# Patient Record
Sex: Male | Born: 2000 | Race: White | Hispanic: No | Marital: Single | Smoking: Never smoker
Health system: Southern US, Community
[De-identification: ages and names within clinical notes are randomized; demographics above are authoritative.]

## PROBLEM LIST (undated history)

## (undated) DIAGNOSIS — S060X9A Concussion with loss of consciousness of unspecified duration, initial encounter: Secondary | ICD-10-CM

## (undated) DIAGNOSIS — F909 Attention-deficit hyperactivity disorder, unspecified type: Secondary | ICD-10-CM

## (undated) DIAGNOSIS — R278 Other lack of coordination: Secondary | ICD-10-CM

## (undated) DIAGNOSIS — F840 Autistic disorder: Secondary | ICD-10-CM

## (undated) DIAGNOSIS — Z8669 Personal history of other diseases of the nervous system and sense organs: Secondary | ICD-10-CM

## (undated) DIAGNOSIS — S060XAA Concussion with loss of consciousness status unknown, initial encounter: Secondary | ICD-10-CM

## (undated) HISTORY — DX: Personal history of other diseases of the nervous system and sense organs: Z86.69

## (undated) HISTORY — DX: Concussion with loss of consciousness of unspecified duration, initial encounter: S06.0X9A

## (undated) HISTORY — PX: CIRCUMCISION: SUR203

## (undated) HISTORY — DX: Autistic disorder: F84.0

## (undated) HISTORY — PX: ADENOIDECTOMY: SUR15

## (undated) HISTORY — DX: Other lack of coordination: R27.8

## (undated) HISTORY — PX: DENTAL SURGERY: SHX609

## (undated) HISTORY — PX: TONSILLECTOMY: SUR1361

## (undated) HISTORY — DX: Concussion with loss of consciousness status unknown, initial encounter: S06.0XAA

---
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
## 2022-08-14 ENCOUNTER — Ambulatory Visit (INDEPENDENT_AMBULATORY_CARE_PROVIDER_SITE_OTHER): Payer: BC Managed Care – PPO | Admitting: Physician Assistant

## 2022-08-14 ENCOUNTER — Encounter: Payer: Self-pay | Admitting: Physician Assistant

## 2022-08-14 VITALS — BP 129/85 | HR 99 | Temp 98.2°F | Wt 330.4 lb

## 2022-08-14 DIAGNOSIS — J020 Streptococcal pharyngitis: Secondary | ICD-10-CM | POA: Diagnosis not present

## 2022-08-14 DIAGNOSIS — R051 Acute cough: Secondary | ICD-10-CM

## 2022-08-14 LAB — VERITOR FLU A/B WAIVED
Influenza A: NEGATIVE
Influenza B: NEGATIVE

## 2022-08-14 LAB — RAPID STREP SCREEN (MED CTR MEBANE ONLY): Strep Gp A Ag, IA W/Reflex: POSITIVE — AB

## 2022-08-14 MED ORDER — AMOXICILLIN 500 MG PO CAPS: 500.0000 mg | ORAL_CAPSULE | Freq: Two times a day (BID) | ORAL | 0 refills | Status: AC

## 2022-08-14 NOTE — Progress Notes (Signed)
Acute Office Visit   Patient: Steve Quinn   DOB: 2001-06-22   21 y.o. Male  MRN: [id number] Visit Date: 08/14/2022  Today's healthcare provider: Oswaldo Conroy Diem Dicocco, PA-C  Introduced myself to the patient as a Secondary school teacher and provided education on APPs in clinical practice.    Chief Complaint  Patient presents with   URI    Pt states he woke up yesterday with a sore throat, cough, fever, and congestion. States he was exposed to the flu on Sunday evening.    Subjective    HPI HPI     URI    Additional comments: Pt states he woke up yesterday with a sore throat, cough, fever, and congestion. States he was exposed to the flu on Sunday evening.       Last edited by Pablo Ledger, CMA on 08/14/2022  8:52 AM.       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   [DISCONTINUED] cloNIDine (CATAPRES) 0.1 MG tablet TAKE 1/2 TABLET BY MOUTH EVERY MORNING AND 1 TABLET EVERY EVENING (Patient not taking: Reported on 10/13/2019)   [DISCONTINUED] FOCALIN XR 20 MG 24 hr capsule Take 1 capsule in the morning (Patient not taking: Reported on 10/13/2019)   No facility-administered medications prior to visit.    Review of Systems  Constitutional:  Positive for fatigue and fever.  HENT:  Positive for congestion, postnasal drip, sinus pressure, sinus pain, sore throat and trouble swallowing.   Respiratory:  Negative for cough,  shortness of breath and wheezing.   Gastrointestinal:  Negative for diarrhea, nausea and vomiting.  Musculoskeletal:  Negative for myalgias.  Neurological:  Positive for headaches. Negative for dizziness.       Objective    BP 129/85   Pulse 99   Temp 98.2 F (36.8 C) (Oral)   Wt (!) 330 lb 6.4 oz (149.9 kg)   SpO2 98%    Physical Exam Vitals reviewed.  Constitutional:      General: He is awake.     Appearance: Normal appearance. He is well-developed and well-groomed.  HENT:     Head: Normocephalic and atraumatic.     Right Ear: There is impacted cerumen.     Left Ear: There is impacted cerumen.     Mouth/Throat:     Lips: Pink.     Mouth: Mucous membranes are moist.     Pharynx: Oropharynx is clear. Uvula midline. Posterior oropharyngeal erythema present. No oropharyngeal exudate.  Cardiovascular:     Rate and Rhythm: Normal rate and regular rhythm.     Heart sounds: Normal heart sounds. No murmur heard.    No friction rub. No gallop.  Pulmonary:     Effort: Pulmonary effort is normal.     Breath sounds: Normal breath sounds. No decreased air movement. No decreased breath sounds, wheezing, rhonchi or rales.  Lymphadenopathy:     Head:  Right side of head: No submental, submandibular or preauricular adenopathy.     Left side of head: No submental, submandibular or preauricular adenopathy.     Cervical:     Right cervical: No superficial or posterior cervical adenopathy.    Left cervical: No superficial or posterior cervical adenopathy.     Upper Body:     Right upper body: No supraclavicular adenopathy.     Left upper body: No supraclavicular adenopathy.  Neurological:     Mental Status: He is alert.  Psychiatric:        Behavior: Behavior is cooperative.       Results for orders placed or performed in visit on 08/14/22  Rapid Strep screen([facility])   Specimen: Other   Other  Result Value Ref Range   Strep Gp A Ag, IA W/Reflex Positive (A)  Negative  Veritor Flu A/B Waived  Result Value Ref Range   Influenza A Negative Negative   Influenza B Negative Negative    Assessment & Plan      Return in about 3 months (around 11/13/2022), or Annual physical exam.      Problem List Items Addressed This Visit   None Visit Diagnoses     Strep throat    -  Primary Acute, new concern Reports symptoms started yesterday with sore throat, cough, and sinus congestion Rapid strep was positive-reviewed results in apt with pt - will send in Amoxicillin 500 mg PO BID x 10 days  Reviewed care instructions with him and recommendations to prevent reinfection Follow up as needed for persistent or progressing symptoms    Relevant Medications   amoxicillin (AMOXIL) 500 MG capsule   Other Relevant Orders   Rapid Strep screen([facility]) (Completed)   Novel Coronavirus, NAA ([facility])   Acute cough     Acute, new concern Rapid strep was positive- concerned that he may have two acute illnesses given symptoms  Reviewed Abx for strep and recommend symptomatic management for congestion, cough  Instructions provided via AVS and during apt  Follow up as needed for persistent or progressing symptoms    Relevant Orders   Veritor Flu A/B Waived (Completed)   Novel Coronavirus, NAA ([facility])        Return in about 3 months (around 11/13/2022), or Annual physical exam.   I, Azzie Thiem E Keerthi Hazell, PA-C, have reviewed all documentation for this visit. The documentation on 08/14/22 for the exam, diagnosis, procedures, and orders are all accurate and complete.   Jacquelin Hawking, MHS, PA-C [facility]

## 2022-08-14 NOTE — Patient Instructions (Addendum)
Your rapid Group A Strep test came back positive / your Strep culture came back positive This indicates an active Strep Pharyngitis or strep throat infection which will need an antibiotic to resolve to prevent further complications I have sent in a prescription for Amoxicillin 500 mg to be taken by mouth twice per day for 10 days / FINISH THE ENTIRE COURSE unless you are instructed to stop or develop an allergic reaction  Stay well hydrated, I usually recommend consuming about 75 oz or more of water and hydrating beverages per day while recovering from such an infection.   You can use over the counter Ibuprofen and Tylenol (alternating every 4 hours) as needed to assist with fever and pain/discomfort  I recommend discarding and replacing anything that you have used in your mouth in the last 72 hours prior to your symptoms - this includes your toothbrush, straws, mouth guards, etc. Unless you have a way of sanitizing them to reduce risk of reinfection   Do not share drinks or food with anyone until your antibiotic is complete  If you have further concerns or your symptoms seem like they are getting worse, please let us know   I also believe you may have a cold at this time since you also have congestion and upper respiratory symptoms that are usually not caused by Strep throat.  Symptoms can last for 3-10 days with lingering cough and intermittent symptoms lasting weeks after that.  The goal of treatment at this time is to reduce your symptoms and discomfort    You can use over the counter medications such as Dayquil/Nyquil, AlkaSeltzer formulations, etc to provide further relief of symptoms according to the manufacturer's instructions  If preferred you can use Coricidin to manage your symptoms rather than those medications mentioned above.    If your symptoms do not improve or become worse in the next 5-7 days please make an apt at the office so we can see you  Go to the ER if you begin to  have more serious symptoms such as shortness of breath, trouble breathing, loss of consciousness, swelling around the eyes, high fever, severe lasting headaches, vision changes or neck pain/stiffness.

## 2022-08-16 LAB — NOVEL CORONAVIRUS, NAA: SARS-CoV-2, NAA: NOT DETECTED

## 2022-11-13 ENCOUNTER — Encounter: Payer: Self-pay | Admitting: Family Medicine

## 2022-11-18 ENCOUNTER — Emergency Department (HOSPITAL_BASED_OUTPATIENT_CLINIC_OR_DEPARTMENT_OTHER): Payer: No Typology Code available for payment source | Admitting: Radiology

## 2022-11-18 ENCOUNTER — Other Ambulatory Visit: Payer: Self-pay

## 2022-11-18 ENCOUNTER — Emergency Department (HOSPITAL_BASED_OUTPATIENT_CLINIC_OR_DEPARTMENT_OTHER)
Admission: EM | Admit: 2022-11-18 | Discharge: 2022-11-18 | Disposition: A | Discharge status: Home / Self Care | Payer: No Typology Code available for payment source | Attending: Emergency Medicine | Admitting: Emergency Medicine

## 2022-11-18 ENCOUNTER — Encounter (HOSPITAL_BASED_OUTPATIENT_CLINIC_OR_DEPARTMENT_OTHER): Payer: Self-pay

## 2022-11-18 DIAGNOSIS — Y990 Civilian activity done for income or pay: Secondary | ICD-10-CM | POA: Diagnosis not present

## 2022-11-18 DIAGNOSIS — S99911A Unspecified injury of right ankle, initial encounter: Secondary | ICD-10-CM | POA: Diagnosis not present

## 2022-11-18 DIAGNOSIS — X509XXA Other and unspecified overexertion or strenuous movements or postures, initial encounter: Secondary | ICD-10-CM | POA: Insufficient documentation

## 2022-11-18 DIAGNOSIS — M25571 Pain in right ankle and joints of right foot: Secondary | ICD-10-CM | POA: Diagnosis present

## 2022-11-18 MED ORDER — NAPROXEN 375 MG PO TABS: 375.0000 mg | ORAL_TABLET | Freq: Two times a day (BID) | ORAL | 0 refills | Status: AC

## 2022-11-18 NOTE — ED Provider Notes (Signed)
[facility] Provider Note   CSN: [id number] Arrival date & time: 11/18/22  1142     History  Chief Complaint  Patient presents with   Ankle Pain    Steve Quinn is a 22 y.o. male who presents emergency department with a chief complaint of right ankle pain.  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  He stepped onto uneven surface, rolled his right ankle and had immediate searing pain.  He states that after a few minutes he was able to move the ankle but had a lot of pain trying to bear weight.  He has no previous injuries to this area.  Denies numbness or tingling.    Ankle Pain      Home Medications Prior to Admission medications   Not on File      Allergies    Patient has no known allergies.    Review of Systems   Review of Systems  Physical Exam Updated Vital Signs BP 129/84   Pulse 79   Temp 98.3 F (36.8 C) (Oral)   Resp 20   Ht 6\' 3"  (1.905 m)   Wt 136.1 kg   SpO2 100%   BMI 37.50 kg/m  Physical Exam Musculoskeletal:     Comments: R ankle exam: There is swelling and tenderness over the lateral malleolus. No tenderness over the medial aspect of the ankle. The fifth metatarsal is not tender. The ankle joint is intact without excessive opening on stressing. The rest of the foot, ankle and leg exam is normal.     ED Results / Procedures / Treatments   Labs (all labs ordered are listed, but only abnormal results are displayed) Labs Reviewed - No data to display  EKG None  Radiology DG Ankle Complete Right  Result Date: 11/18/2022 CLINICAL DATA:  Trauma, pain EXAM: RIGHT ANKLE - COMPLETE 3+ VIEW COMPARISON:  None Available. FINDINGS: There is minimal cortical irregularity in the lateral margin of talus inferior to the lateral malleolus. Rest of the bony structures are unremarkable. There is marked soft tissue swelling over the lateral malleolus. Small plantar spur is seen in calcaneus.  IMPRESSION: There is minimal cortical irregularity in the lateral margin of talus which may suggest recent or old avulsion. There is marked soft tissue swelling over the lateral malleolus. Small plantar spur is seen in calcaneus. Electronically Signed   By: Elmer Picker M.D.   On: 11/18/2022 12:54    Procedures Procedures    Medications Ordered in ED Medications - No data to display  ED Course/ Medical Decision Making/ A&P                             Medical Decision Making Amount and/or Complexity of Data Reviewed Radiology: ordered and independent interpretation performed.    Details: I personally visualized and interpreted the images using our PACS system. Acute findings include:  None    Patient X-Ray negative for obvious fracture or dislocation. Pain managed in ED.   Home Care: Rest and elevate the injured ankle, apply ice intermittently. Use crutches without weight bearing until able to comfortable bear partial weight, then progress to full weight bearing as tolerated. Splint applied. See ortho prn.  Patient will be dc home & is agreeable with above plan.         Final Clinical Impression(s) / ED Diagnoses Final diagnoses:  None  Rx / DC Orders ED Discharge Orders     None         Margarita Mail, PA-C 11/18/22 1327    Regan Lemming, MD 11/18/22 [phone]

## 2022-11-18 NOTE — Discharge Instructions (Signed)
Contact a health care provider if: You have bruising or swelling that get worse all of a sudden. Your pain does not get better with medicine. Get help right away if: Your foot or toes become numb or blue. You have severe pain that gets worse.

## 2022-11-18 NOTE — ED Triage Notes (Signed)
Patient arrives to ED POV C/O RT Ankle pain. Per pt he was getting out of his truck and stepped into a hole in the ground and twisted his ankle. Pt states painful to bear weight. Pt A/O x4. No other complaints at this time.

## 2022-11-19 ENCOUNTER — Telehealth: Payer: Self-pay

## 2022-11-19 NOTE — Transitions of Care (Post Inpatient/ED Visit) (Signed)
   11/19/2022  Name: Steve Quinn MRN: [id number] DOB: Aug 07, 2001  Today's TOC FU Call Status: Today's TOC FU Call Status:: Successful TOC FU Call Competed TOC FU Call Complete Date: 11/19/22  Transition Care Management Follow-up Telephone Call Date of Discharge: 11/18/22 Discharge Facility: [facility]) Type of Discharge: Emergency Department Reason for ED Visit: Orthopedic Conditions Orthopedic/Injury Diagnosis: Fracture How have you been since you were released from the hospital?: Better Any questions or concerns?: No  Items Reviewed: Did you receive and understand the discharge instructions provided?: Yes Medications obtained and verified?: Yes (Medications Reviewed) Any new allergies since your discharge?: No Dietary orders reviewed?: NA Do you have support at home?: Yes  Home Care and Equipment/Supplies: [place] Ordered?: NA Any new equipment or medical supplies ordered?: NA  Functional Questionnaire: Do you need assistance with bathing/showering or dressing?: No Do you need assistance with meal preparation?: No Do you need assistance with eating?: No Do you have difficulty maintaining continence: No Do you need assistance with getting out of bed/getting out of a chair/moving?: No Do you have difficulty managing or taking your medications?: No  Follow up appointments reviewed: PCP Follow-up appointment confirmed?: NA Specialist Hospital Follow-up appointment confirmed?: NA Do you need transportation to your follow-up appointment?: No Do you understand care options if your condition(s) worsen?: Yes-patient verbalized understanding    [place], Jennette

## 2022-11-19 NOTE — Telephone Encounter (Signed)
-----   Message from Georgina Peer, [place] sent at 11/19/2022 10:33 AM EDT ----- Needs TOC call completed please

## 2023-02-02 ENCOUNTER — Telehealth: Payer: Self-pay

## 2023-02-02 NOTE — Telephone Encounter (Signed)
LVM for patient to call back [phone], or to call PCP office to schedule follow up apt. AS, CMA  

## 2023-04-28 ENCOUNTER — Encounter: Payer: BC Managed Care – PPO | Admitting: Family Medicine
